# Patient Record
Sex: Female | Born: 1975 | Hispanic: Yes | Marital: Married | Smoking: Never smoker
Health system: Southern US, Community
[De-identification: ages and names within clinical notes are randomized; demographics above are authoritative.]

---

## 2006-06-24 ENCOUNTER — Emergency Department: Payer: Self-pay | Admitting: Emergency Medicine

## 2009-03-18 ENCOUNTER — Emergency Department: Payer: Self-pay | Admitting: Unknown Physician Specialty

## 2009-06-08 ENCOUNTER — Ambulatory Visit: Payer: Self-pay | Admitting: Family Medicine

## 2016-02-25 ENCOUNTER — Ambulatory Visit
Admission: RE | Admit: 2016-02-25 | Discharge: 2016-02-25 | Disposition: A | Discharge status: Home / Self Care | Payer: Self-pay | Source: Ambulatory Visit | Attending: Oncology | Admitting: Oncology

## 2016-02-25 ENCOUNTER — Ambulatory Visit: Payer: Self-pay | Attending: Oncology | Admitting: *Deleted

## 2016-02-25 ENCOUNTER — Ambulatory Visit: Payer: Self-pay

## 2016-02-25 VITALS — BP 114/69 | HR 63 | Temp 97.9°F | Ht 63.39 in | Wt 141.5 lb

## 2016-02-25 DIAGNOSIS — N644 Mastodynia: Secondary | ICD-10-CM

## 2016-02-25 NOTE — Patient Instructions (Signed)
PATIENT INSTRUCTIONS FIBROCYSTIC BREAST DISEASE  FOLLOW-UP:  Call your physician should you develop a new breast mass that is different, if one particular lump starts to enlarge, or if nipple discharge develops.   CAUSE:  Many women have some lumpiness within their breasts and these areas at times can become tender during certain times in your menstrual cycle.  These areas tend to feel like a firm rubber ball as compared to a cancer which will more commonly feel hard and almost rock-like.  Fibrocystic breast disease does not in and of itself increase your risk for breast cancer but you should be sure to examine yiour breasts at the same time of the month on a monthly basis.  If there are a lot of areas of lumpiness you should tape a piece of paper on the mirror with a diagram of your breasts, noting where the areas of lumpiness are and their relative size.  You can then refer to this diagram on a monthly basis to keep better track of any changes should they occur.  DIET:  You should try and avoid foods, or at least minimize foods, such as chocolate and caffeine which may cause the symptoms of tenderness to become worse.  ACTIVITY:  You may want to wear a bra that offers additional support, and/or consider a sports bra, especially during those times when your breasts are more tender.  MEDICATIONS:  Taking Vitamin E capsules twice a day along with Evening of Primrose Capsules three times a day, or as directed on the bottle, may help your symptoms.  These are both available over-the-counter and without a prescription. There is clinical evidence that these may help symptoms in some patients.   If your physician has prescribed medication for your fibrocystic breast disease, be sure to take it as instructed on the bottle and let him/her know if you have any side effects.  QUESTIONS:  Please feel free to call your physician  if you have any questions, and they will be glad to assist you.   Gave patient  hand-out, Women Staying Healthy, Active and Well from [facility], with education on breast health, pap smears, heart and colon health.   

## 2016-02-25 NOTE — Progress Notes (Signed)
Subjective:     Patient ID: Wandra Feinsteinora C Luna Maradiaga, female   DOB: 08/07/1975, 40 y.o.   MRN: [id number]  HPI   Review of Systems     Objective:   Physical Exam  Pulmonary/Chest: Right breast exhibits tenderness. Right breast exhibits no inverted nipple, no mass, no nipple discharge and no skin change. Left breast exhibits tenderness. Left breast exhibits no inverted nipple, no mass, no nipple discharge and no skin change. Breasts are symmetrical.         Assessment:     40 year old Hispanic [facility]  [name], the interpreter present during the interview and exam.  Patient states she has experienced breast pain for over 19 years, since her daughter was born.  States it's worse with her periods and then gets better.  States she has increased "stabbing pain" in the lower outer quadrant of the left breast.  States it her hurts when lays on her left side, and it radiates from the left outer quadrant to the center of the chest while sitting up.  She states she does drink some caffeine, but takes Excedrin daily.  On clinical breast exam, there is no dominant mass, skin changes or lymphadenopathy.  Bilateral breast are tender to palpation. Taught self breast awareness.  Patient has been screened for eligibility.  She does not have any insurance, Medicare or Medicaid.  She also meets financial eligibility.  Hand-out given on the Affordable Care Act.    Plan:     Bilateral diagnostic mammogram with ultrasound ordered.  Hand-out on how to manage breast pain given to patient.  She is to decrease her caffeine intake, and try over the counter vitamin E.  Will follow-up per [facility] protocol.

## 2016-02-26 ENCOUNTER — Encounter: Payer: Self-pay | Admitting: *Deleted

## 2016-02-26 NOTE — Progress Notes (Signed)
Patient with normal mammogram results.  Since patient has experienced breast pain for the last 19 years I don't believe any further work-up is needed.  She is to try the Vitamin E as dicussed at her visit.  Letter mailed to inform patient of her normal mammogram results.  She is to follow-up in one year with annual screening.  HSIS to Ronanhristy.

## 2017-04-27 ENCOUNTER — Other Ambulatory Visit: Payer: Self-pay | Admitting: *Deleted

## 2017-04-27 ENCOUNTER — Encounter: Payer: Self-pay | Admitting: *Deleted

## 2017-04-27 ENCOUNTER — Encounter (INDEPENDENT_AMBULATORY_CARE_PROVIDER_SITE_OTHER): Payer: Self-pay

## 2017-04-27 ENCOUNTER — Ambulatory Visit: Payer: Self-pay | Attending: Oncology | Admitting: *Deleted

## 2017-04-27 ENCOUNTER — Ambulatory Visit
Admission: RE | Admit: 2017-04-27 | Discharge: 2017-04-27 | Disposition: A | Discharge status: Home / Self Care | Payer: Self-pay | Source: Ambulatory Visit | Attending: Oncology | Admitting: Oncology

## 2017-04-27 VITALS — BP 108/76 | HR 77 | Temp 97.3°F | Ht 63.0 in | Wt 144.0 lb

## 2017-04-27 DIAGNOSIS — Z0000 Encounter for general adult medical examination without abnormal findings: Secondary | ICD-10-CM

## 2017-04-27 DIAGNOSIS — N63 Unspecified lump in unspecified breast: Secondary | ICD-10-CM

## 2017-04-27 NOTE — Patient Instructions (Signed)
Gave patient hand-out, Women Staying Healthy, Active and Well from [facility], with education on breast health, pap smears, heart and colon health. 

## 2017-04-27 NOTE — Progress Notes (Signed)
Subjective:     Patient ID: Deborah Page, female   DOB: 11/27/1975, 42 y.o.   MRN: [id number]  HPI   Review of Systems     Objective:   Physical Exam  Pulmonary/Chest: Right breast exhibits no inverted nipple, no mass, no nipple discharge, no skin change and no tenderness. Left breast exhibits no inverted nipple, no mass, no nipple discharge, no skin change and no tenderness. Breasts are symmetrical.         Assessment:     42 year old Hispanic female [facility].  [name], the interpreter present during the interview and exam.  On clinical breast exam the patient has extremely tender breast to palpation.  Bilateral breast have very grainy fibroglandular tissue.  Reviewed history of breast pain with patient per my notes from last year.  States she has had breast pain since there birth of her child.  She states she did try vitamin E as recommended last year with some relief.  States she take 3 Excedrin per day for her headaches.  Discussed correlation of breast pain and caffeine intake.  Explained that Excedrin has a lot of caffeine.  Encouraged her to decrease caffeine by reducing the amount of Excedrin she takes per day.  States she was given a prescription to take for headaches, but she has not tried it yet.  States she will try the new prescription, decrease the Excedrin and try the Vitamin E again.  I also encouraged her to discuss taking the vitamin E with her pharmacist.  She is agreeable.  Taught self breast awareness.  Last pap on 10/25/16 was negative without HPV co-testing.  Next pap due in 2021.  Patient has been screened for eligibility.  She does not have any insurance, Medicare or Medicaid.  She also meets financial eligibility.  Hand-out given on the Affordable Care Act.      Plan:     Screening mammogram ordered.  Will follow-up per [facility] protocol.

## 2017-04-28 ENCOUNTER — Other Ambulatory Visit: Payer: Self-pay | Admitting: *Deleted

## 2017-05-08 ENCOUNTER — Ambulatory Visit
Admission: RE | Admit: 2017-05-08 | Discharge: 2017-05-08 | Disposition: A | Discharge status: Home / Self Care | Payer: Self-pay | Source: Ambulatory Visit | Attending: Oncology | Admitting: Oncology

## 2017-05-08 DIAGNOSIS — N63 Unspecified lump in unspecified breast: Secondary | ICD-10-CM

## 2017-05-20 ENCOUNTER — Encounter: Payer: Self-pay | Admitting: *Deleted

## 2017-05-20 NOTE — Progress Notes (Signed)
Letter mailed from [facility] to inform patient of her normal mammogram results.  Patient is to follow-up with annual screening in one year.  HSIS to Christy. 

## 2018-05-19 ENCOUNTER — Ambulatory Visit
Admission: RE | Admit: 2018-05-19 | Discharge: 2018-05-19 | Disposition: A | Discharge status: Home / Self Care | Payer: Self-pay | Source: Ambulatory Visit | Attending: Oncology | Admitting: Oncology

## 2018-05-19 ENCOUNTER — Encounter (INDEPENDENT_AMBULATORY_CARE_PROVIDER_SITE_OTHER): Payer: Self-pay

## 2018-05-19 ENCOUNTER — Ambulatory Visit: Payer: Self-pay | Attending: Oncology | Admitting: *Deleted

## 2018-05-19 ENCOUNTER — Encounter: Payer: Self-pay | Admitting: *Deleted

## 2018-05-19 VITALS — BP 127/85 | HR 90 | Temp 98.2°F | Ht 62.0 in | Wt 139.7 lb

## 2018-05-19 DIAGNOSIS — Z0000 Encounter for general adult medical examination without abnormal findings: Secondary | ICD-10-CM | POA: Insufficient documentation

## 2018-05-19 NOTE — Patient Instructions (Signed)
Gave patient hand-out, Women Staying Healthy, Active and Well from [facility], with education on breast health, pap smears, heart and colon health. 

## 2018-05-19 NOTE — Progress Notes (Signed)
  Subjective:     Patient ID: Deborah Page, female   DOB: 09/29/1975, 43 y.o.   MRN: [id number]  HPI   Review of Systems     Objective:   Physical Exam Chest:     Breasts:        Right: No swelling, bleeding, inverted nipple, mass, nipple discharge, skin change or tenderness.        Left: No swelling, bleeding, inverted nipple, mass, nipple discharge, skin change or tenderness.    Lymphadenopathy:     Upper Body:     Right upper body: No supraclavicular or axillary adenopathy.     Left upper body: No supraclavicular or axillary adenopathy.        Assessment:     43 year old Hispanic female [facility]  Deborah Page, the interpreter present during the interview and exam.  Clinical breast exam unremarkable.  Taught self breast awareness.  Last pap on 11/04/16 was negative / without HPV co-testing.  Next pap due in 2021.  Patient has been screened for eligibility.  She does not have any insurance, Medicare or Medicaid.  She also meets financial eligibility.  Hand-out given on the Affordable Care Act.  Risk Assessment    Risk Scores      05/19/2018   Last edited by: Alta Corningover, Melissa G, CMA   5-year risk: 0.4 %   Lifetime risk: 6.3 %            Plan:     Screening mammogram ordered.  Will follow-up per [facility] protocol.

## 2018-06-07 ENCOUNTER — Encounter: Payer: Self-pay | Admitting: *Deleted

## 2018-06-07 NOTE — Progress Notes (Signed)
Letter mailed from [facility] to inform patient of her normal mammogram results.  Patient is to follow-up with annual screening in one year.  HSIS to Christy. 

## 2019-07-18 ENCOUNTER — Ambulatory Visit: Payer: Self-pay | Attending: Internal Medicine

## 2019-07-22 ENCOUNTER — Ambulatory Visit: Payer: Self-pay | Attending: Internal Medicine

## 2019-07-22 ENCOUNTER — Ambulatory Visit: Payer: Self-pay

## 2019-07-22 NOTE — Progress Notes (Signed)
   Covid-19 Vaccination Clinic  Name:  Deborah Page    MRN: [id number] DOB: 1975-09-15  07/22/2019  Ms. Deborah Page was observed post Covid-19 immunization for 15 minutes without incident. She was provided with Vaccine Information Sheet and instruction to access the V-Safe system.   Ms. Deborah Page was instructed to call 911 with any severe reactions post vaccine: Marland Kitchen Difficulty breathing  . Swelling of face and throat  . A fast heartbeat  . A bad rash all over body  . Dizziness and weakness

## 2019-08-12 ENCOUNTER — Ambulatory Visit: Payer: Self-pay | Attending: Internal Medicine

## 2019-08-12 DIAGNOSIS — Z23 Encounter for immunization: Secondary | ICD-10-CM

## 2019-08-12 NOTE — Progress Notes (Signed)
   Covid-19 Vaccination Clinic  Name:  Deborah Page    MRN: [id number] DOB: 09/12/1975  08/12/2019  Ms. Deborah Page was observed post Covid-19 immunization for 15 minutes without incident. She was provided with Vaccine Information Sheet and instruction to access the V-Safe system.   Ms. Deborah Page was instructed to call 911 with any severe reactions post vaccine: Marland Kitchen Difficulty breathing  . Swelling of face and throat  . A fast heartbeat  . A bad rash all over body  . Dizziness and weakness   Immunizations Administered    Name Date Dose VIS Date Route   Pfizer COVID-19 Vaccine 08/12/2019  1:52 PM 0.3 mL 06/15/2018 Intramuscular   Manufacturer: [employer], [employer]   Lot: [id number]   NDC: [address]-3

## 2019-10-18 ENCOUNTER — Ambulatory Visit
Admission: RE | Admit: 2019-10-18 | Discharge: 2019-10-18 | Disposition: A | Discharge status: Home / Self Care | Payer: Self-pay | Source: Ambulatory Visit | Attending: Oncology | Admitting: Oncology

## 2019-10-18 ENCOUNTER — Other Ambulatory Visit: Payer: Self-pay

## 2019-10-18 ENCOUNTER — Ambulatory Visit: Payer: Self-pay | Attending: Oncology

## 2019-10-18 VITALS — BP 111/72 | HR 72 | Temp 97.6°F | Resp 20 | Ht 62.0 in | Wt 141.9 lb

## 2019-10-18 DIAGNOSIS — Z0000 Encounter for general adult medical examination without abnormal findings: Secondary | ICD-10-CM

## 2019-10-18 NOTE — Progress Notes (Signed)
  Subjective:     Patient ID: Deborah Page, female   DOB: 02/17/1976, 44 y.o.   MRN: [id number]  HPI   Review of Systems     Objective:   Physical Exam Chest:     Breasts:        Right: No swelling, bleeding, inverted nipple, mass, nipple discharge, skin change or tenderness.        Left: No swelling, bleeding, inverted nipple, mass, nipple discharge, skin change or tenderness.          Assessment:     44 year old Hispanic patient returns for [facility] screening.  Delos Haring interpreted exam.  Patient screened, and meets [facility] eligibility.  Patient does not have insurance, Medicare or Medicaid. Instructed patient on breast self awareness using teach back method. Clinical breast exam unremarkable. Symmetrical fibroglandular tissue palpated bilateral in lower breasts.    Plan:     Sent for bilateral screening mammogram.

## 2019-10-18 NOTE — Progress Notes (Signed)
Already has an pap smear appt for jul

## 2019-10-21 NOTE — Progress Notes (Signed)
Letter mailed from [facility] to notify of normal mammogram results.  Patient to return in one year for annual screening.  Copy to HSIS. 

## 2020-10-23 ENCOUNTER — Encounter: Payer: Self-pay | Admitting: *Deleted

## 2020-10-23 ENCOUNTER — Ambulatory Visit: Payer: Self-pay | Attending: Oncology | Admitting: *Deleted

## 2020-10-23 ENCOUNTER — Other Ambulatory Visit: Payer: Self-pay

## 2020-10-23 ENCOUNTER — Ambulatory Visit
Admission: RE | Admit: 2020-10-23 | Discharge: 2020-10-23 | Disposition: A | Discharge status: Home / Self Care | Payer: Self-pay | Source: Ambulatory Visit | Attending: Oncology | Admitting: Oncology

## 2020-10-23 VITALS — BP 115/77 | HR 76 | Temp 97.0°F | Ht 60.0 in | Wt 141.0 lb

## 2020-10-23 DIAGNOSIS — Z0000 Encounter for general adult medical examination without abnormal findings: Secondary | ICD-10-CM | POA: Insufficient documentation

## 2020-10-23 NOTE — Progress Notes (Signed)
  Subjective:     Patient ID: Deborah Page, female   DOB: January 27, 1976, 45 y.o.   MRN: [id number]  HPI   Review of Systems     Objective:   Physical Exam Chest:  Breasts:    Right: No swelling, bleeding, inverted nipple, mass, nipple discharge, skin change, tenderness, axillary adenopathy or supraclavicular adenopathy.     Left: No swelling, bleeding, inverted nipple, mass, nipple discharge, skin change, tenderness, axillary adenopathy or supraclavicular adenopathy.  Lymphadenopathy:     Upper Body:     Right upper body: No supraclavicular or axillary adenopathy.     Left upper body: No supraclavicular or axillary adenopathy.      Assessment:     45 year old female returns to [facility] for [facility] exam.  Donnie Coffin # [id number] [facility] interpreters used via video during the interview and exam.  Clinical breast exam unremarkable.  Taught self breast awareness.  Last pap smear was on 11/14/19.  Next pap will be due per [facility] guidelines. Patient has been screened for eligibility.  She does not have any insurance, Medicare or Medicaid.  She also meets financial eligibility.   Risk Assessment     Risk Scores       10/23/2020 10/18/2019   Last edited by: Scarlett Presto, RN Neita Garnet, CMA   5-year risk: 0.4 % 0.4 %   Lifetime risk: 6.2 % 6.3 %              Plan:     Screening mammogram ordered.  Will follow up per [facility] protocol.

## 2020-10-23 NOTE — Patient Instructions (Signed)
Gave patient hand-out, Women Staying Healthy, Active and Well from [facility], with education on breast health, pap smears, heart and colon health. 

## 2020-10-25 ENCOUNTER — Encounter: Payer: Self-pay | Admitting: *Deleted

## 2020-10-25 NOTE — Progress Notes (Signed)
Letter mailed from [facility] to inform patient of her normal mammogram results.  Patient is to follow-up with annual screening in one year. 

## 2021-12-10 ENCOUNTER — Ambulatory Visit: Payer: Self-pay

## 2022-01-09 ENCOUNTER — Other Ambulatory Visit: Payer: Self-pay

## 2022-01-09 DIAGNOSIS — Z1231 Encounter for screening mammogram for malignant neoplasm of breast: Secondary | ICD-10-CM

## 2022-01-14 ENCOUNTER — Ambulatory Visit: Payer: Self-pay | Attending: Hematology and Oncology | Admitting: Hematology and Oncology

## 2022-01-14 ENCOUNTER — Ambulatory Visit
Admission: RE | Admit: 2022-01-14 | Discharge: 2022-01-14 | Disposition: A | Discharge status: Home / Self Care | Payer: Self-pay | Source: Ambulatory Visit | Attending: Obstetrics and Gynecology | Admitting: Obstetrics and Gynecology

## 2022-01-14 ENCOUNTER — Other Ambulatory Visit: Payer: Self-pay

## 2022-01-14 VITALS — BP 128/77 | Wt 143.7 lb

## 2022-01-14 DIAGNOSIS — Z1211 Encounter for screening for malignant neoplasm of colon: Secondary | ICD-10-CM

## 2022-01-14 DIAGNOSIS — Z1231 Encounter for screening mammogram for malignant neoplasm of breast: Secondary | ICD-10-CM

## 2022-01-14 NOTE — Progress Notes (Signed)
Deborah Page is a 46 y.o. female who presents to [facility] clinic today with no complaints .    Pap Smear: Pap not smear completed today. Last Pap smear was 2021 at [facility] clinic and was normal. Per patient has no history of an abnormal Pap smear. Last Pap smear result is not available in Epic.   Physical exam: Breasts Breasts symmetrical. No skin abnormalities bilateral breasts. No nipple retraction bilateral breasts. No nipple discharge bilateral breasts. No lymphadenopathy. No lumps palpated bilateral breasts.     MS DIGITAL SCREENING TOMO BILATERAL  Result Date: 10/24/2020 CLINICAL DATA:  Screening. EXAM: DIGITAL SCREENING BILATERAL MAMMOGRAM WITH TOMOSYNTHESIS AND CAD TECHNIQUE: Bilateral screening digital craniocaudal and mediolateral oblique mammograms were obtained. Bilateral screening digital breast tomosynthesis was performed. The images were evaluated with computer-aided detection. COMPARISON:  Previous exam(s). ACR Breast Density Category c: The breast tissue is heterogeneously dense, which may obscure small masses. FINDINGS: There are no findings suspicious for malignancy. IMPRESSION: No mammographic evidence of malignancy. A result letter of this screening mammogram will be mailed directly to the patient. RECOMMENDATION: Screening mammogram in one year. (Code:SM-B-01Y) BI-RADS CATEGORY  1: Negative. Electronically Signed   By: Lillia Mountain M.D.   On: 10/24/2020 15:07  MS DIGITAL SCREENING TOMO BILATERAL  Result Date: 10/20/2019 CLINICAL DATA:  Screening. EXAM: DIGITAL SCREENING BILATERAL MAMMOGRAM WITH TOMO AND CAD COMPARISON:  Previous exam(s). ACR Breast Density Category d: The breast tissue is extremely dense, which lowers the sensitivity of mammography FINDINGS: There are no findings suspicious for malignancy. Images were processed with CAD. IMPRESSION: No mammographic evidence of malignancy. A result letter of this screening mammogram will be mailed directly to the patient.  RECOMMENDATION: Screening mammogram in one year. (Code:SM-B-01Y) BI-RADS CATEGORY  1: Negative. Electronically Signed   By: Lovey Newcomer M.D.   On: 10/20/2019 09:15   MS DIGITAL SCREENING TOMO BILATERAL  Result Date: 05/19/2018 CLINICAL DATA:  Screening. EXAM: DIGITAL SCREENING BILATERAL MAMMOGRAM WITH TOMO AND CAD COMPARISON:  Previous exam(s). ACR Breast Density Category d: The breast tissue is extremely dense, which lowers the sensitivity of mammography FINDINGS: There are no findings suspicious for malignancy. Images were processed with CAD. IMPRESSION: No mammographic evidence of malignancy. A result letter of this screening mammogram will be mailed directly to the patient. RECOMMENDATION: Screening mammogram in one year. (Code:SM-B-01Y) BI-RADS CATEGORY  1: Negative. Electronically Signed   By: Lajean Manes M.D.   On: 05/19/2018 13:58   MS DIGITAL DIAG TOMO UNI LEFT  Result Date: 05/08/2017 CLINICAL DATA:  Callback from screening mammogram for a possible mass left breast EXAM: 2D DIGITAL DIAGNOSTIC LEFT MAMMOGRAM WITH CAD AND ADJUNCT TOMO ULTRASOUND LEFT BREAST COMPARISON:  Previous exam(s). ACR Breast Density Category d: The breast tissue is extremely dense, which lowers the sensitivity of mammography. FINDINGS: Lateral view of left breast, spot compression left MLO view are submitted. The previously questioned mass persists and appears to have focal notched fat in the axillary tail. Mammographic images were processed with CAD. Targeted ultrasound is performed, showing a 2.2 cm normal intramammary lymph node at the left axillary tail left breast 1:30 o'clock 7 cm from nipple correlating to the mammographic finding. IMPRESSION: Benign findings. RECOMMENDATION: Routine screening mammogram back on schedule. I have discussed the findings and recommendations with the patient. Results were also provided in writing at the conclusion of the visit. If applicable, a reminder letter will be sent to the patient  regarding the next appointment. BI-RADS CATEGORY  2: Benign.  Electronically Signed   By: Abelardo Diesel M.D.   On: 05/08/2017 14:23   MS DIGITAL SCREENING TOMO BILATERAL  Result Date: 04/27/2017 CLINICAL DATA:  Screening. EXAM: 2D DIGITAL SCREENING BILATERAL MAMMOGRAM WITH 3D TOMO WITH CAD COMPARISON:  Previous exam(s). ACR Breast Density Category c: The breast tissue is heterogeneously dense, which may obscure small masses. FINDINGS: In the left breast, a possible mass warrants further evaluation. In the right breast, no findings suspicious for malignancy. Images were processed with CAD. IMPRESSION: Further evaluation is suggested for possible mass in the left breast. RECOMMENDATION: Diagnostic mammogram and possibly ultrasound of the left breast. (Code:FI-L-37M) The patient will be contacted regarding the findings, and additional imaging will be scheduled. BI-RADS CATEGORY  0: Incomplete. Need additional imaging evaluation and/or prior mammograms for comparison. Electronically Signed   By: Lovey Newcomer M.D.   On: 04/27/2017 12:34      Pelvic/Bimanual Pap is not indicated today    Smoking History: Patient has never smoked and was not referred to quit line.    Patient Navigation: Patient education provided. Access to services provided for patient through [place] interpreter provided. No transportation provided   Colorectal Cancer Screening: Per patient has never had colonoscopy completed No complaints today.    Breast and Cervical Cancer Risk Assessment: Patient does not have family history of breast cancer, known genetic mutations, or radiation treatment to the chest before age 40. Patient does not have history of cervical dysplasia, immunocompromised, or DES exposure in-utero.  Risk Assessment     Risk Scores       01/14/2022 10/23/2020   Last edited by: Demetrius Revel, LPN Theodore Demark, RN   5-year risk: 0.5 % 0.4 %   Lifetime risk: 6.1 % 6.2 %             A: [facility] exam without pap smear No complaints with benign exam.  P: Referred patient to the [facility] for a screening mammogram. Appointment scheduled 01/14/2009.  Dayton Scrape A, NP 01/14/2022 9:09 AM

## 2022-01-19 IMAGING — MG DIGITAL SCREENING BILAT W/ TOMO W/ CAD
8 series · 8 of 24 positions shown · non-contrast
Comparison: Previous exam(s).

CLINICAL DATA: Screening.

EXAM:
DIGITAL SCREENING BILATERAL MAMMOGRAM WITH TOMO AND CAD

[L MLO synth-2D]
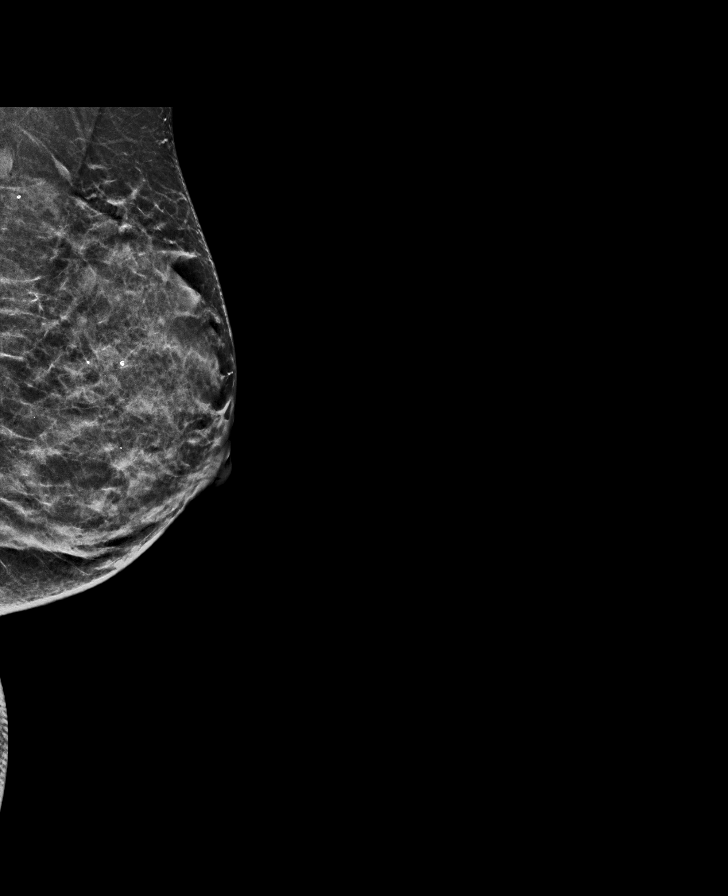

[R MLO synth-2D]
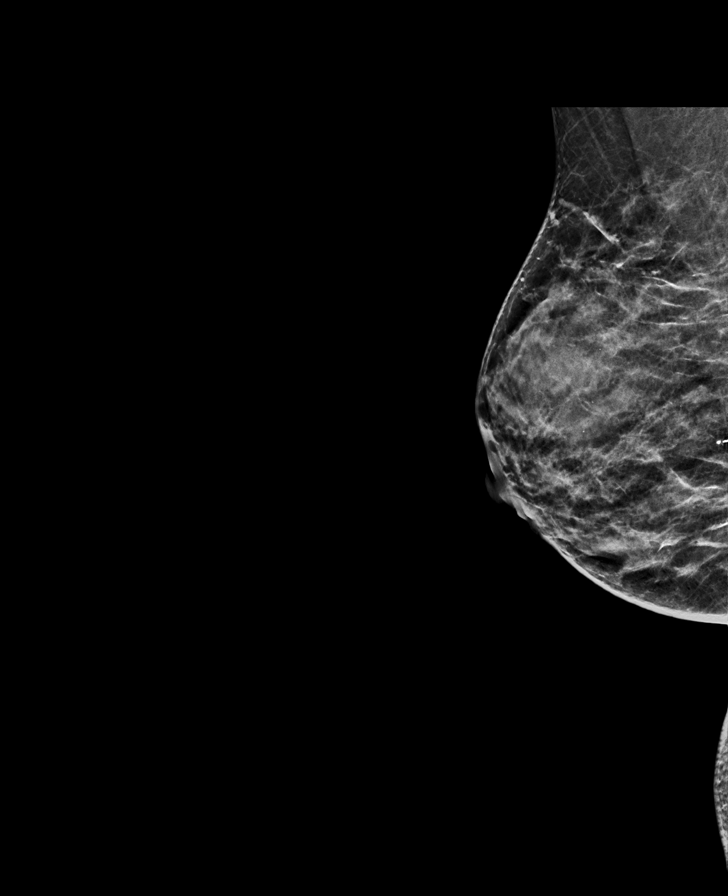

[R CC synth-2D]
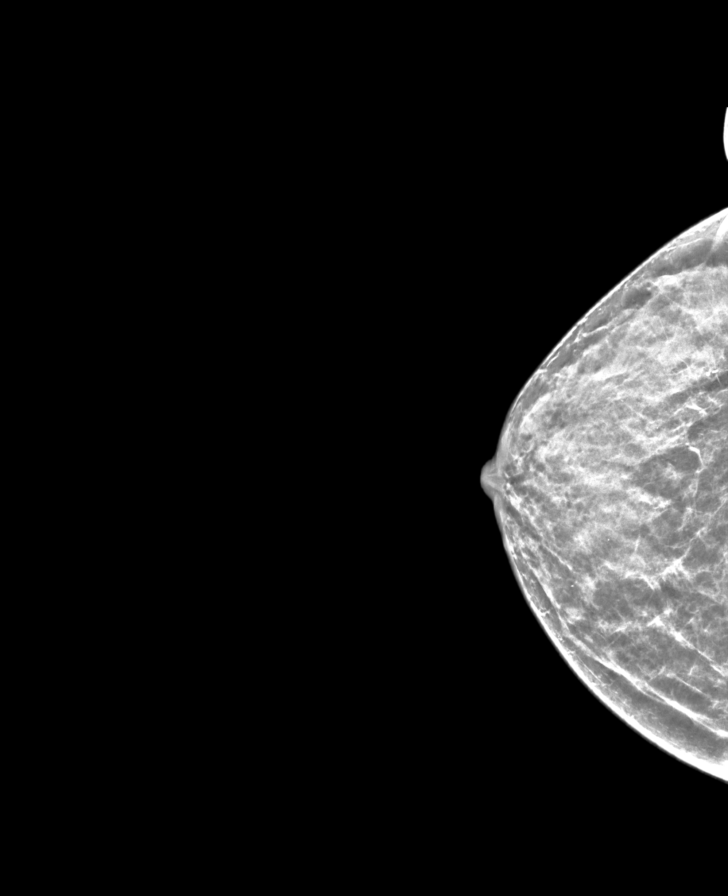

[L CC synth-2D]
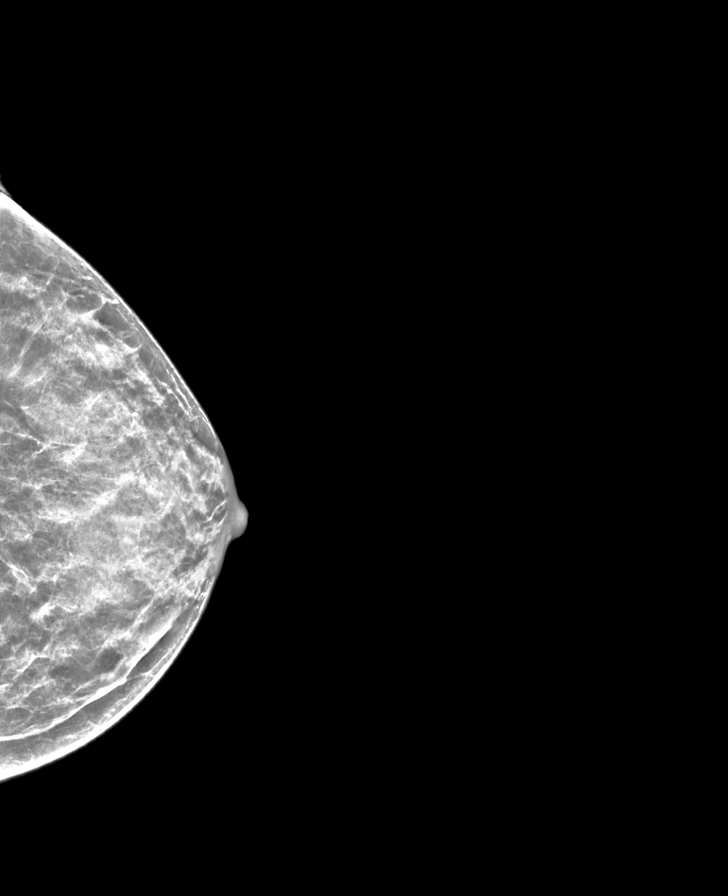

[L CC tomo · tomo slice 26/51.0]
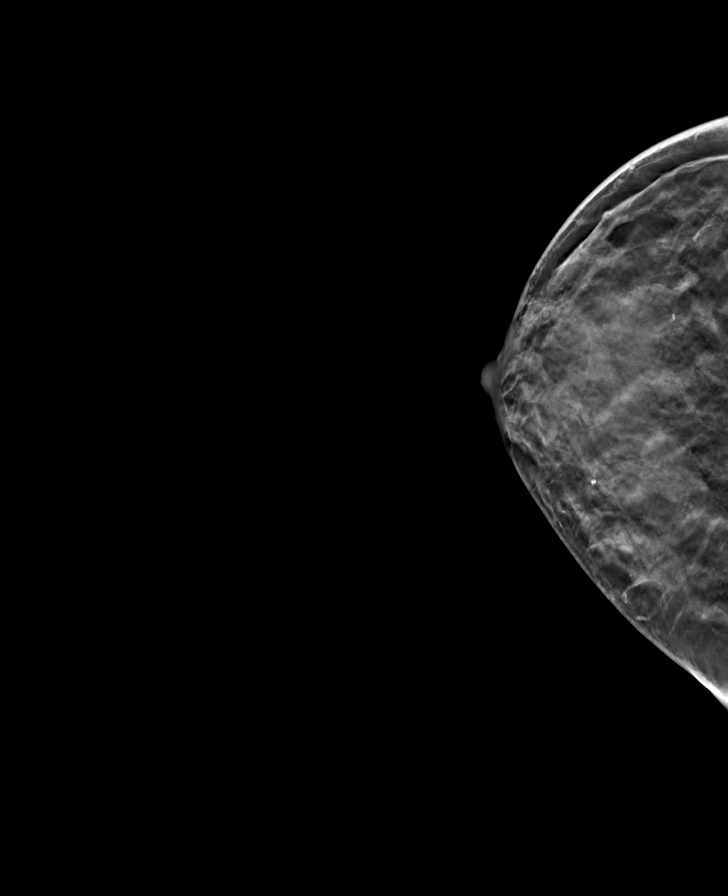

[R CC tomo · tomo slice 27/53.0]
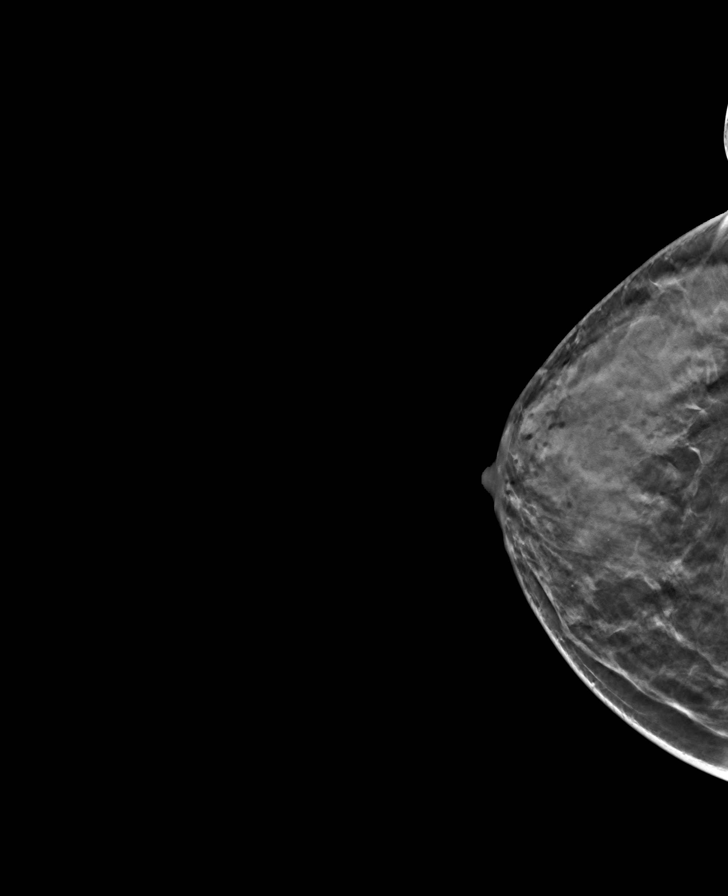

[L MLO tomo · tomo slice 28/55.0]
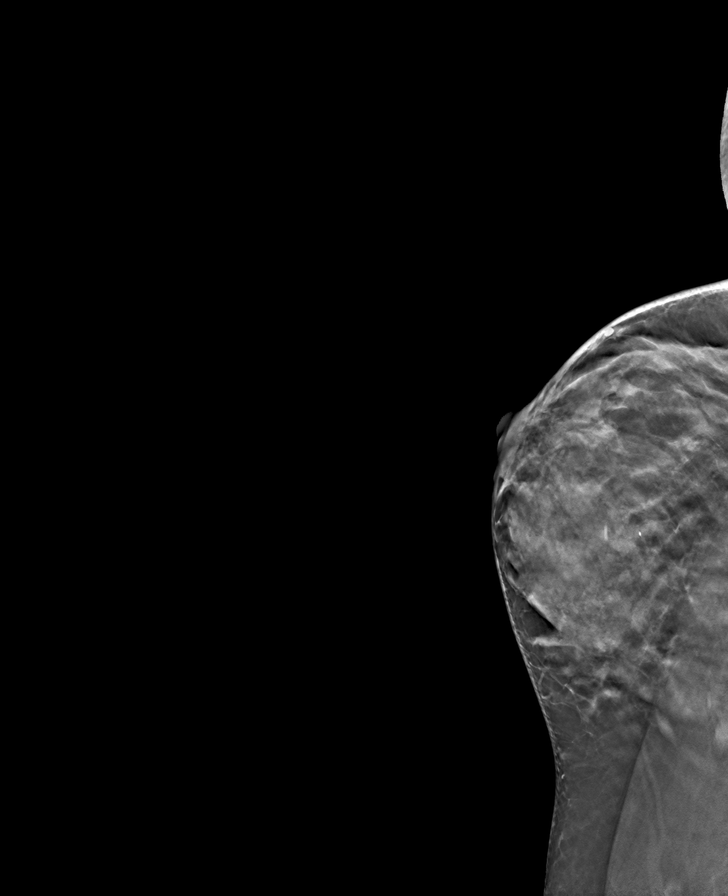

[R MLO tomo · tomo slice 27/53.0]
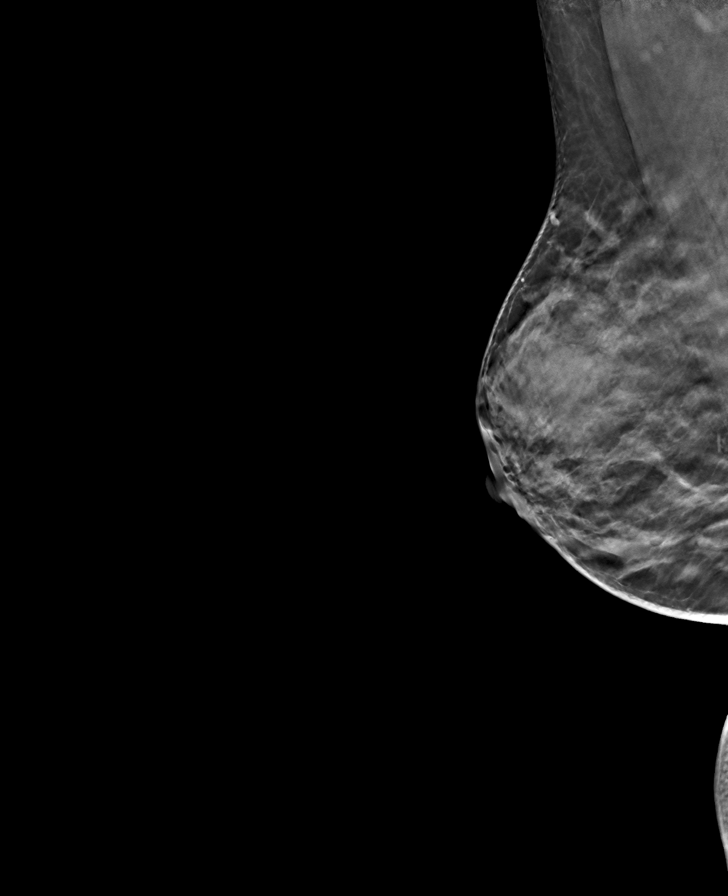

[8 of 24 positions shown; findings below may reference images not displayed]

ACR Breast Density Category d: The breast tissue is extremely dense,
which lowers the sensitivity of mammography
FINDINGS: There are no findings suspicious for malignancy. Images were
processed with CAD.
IMPRESSION: No mammographic evidence of malignancy. A result letter of this
screening mammogram will be mailed directly to the patient.

RECOMMENDATION:
Screening mammogram in one year. (Code:WO-[id number])

BI-RADS CATEGORY  1: Negative.

## 2022-08-11 ENCOUNTER — Encounter: Payer: Self-pay | Admitting: Family Medicine

## 2022-08-19 ENCOUNTER — Ambulatory Visit: Payer: Self-pay

## 2022-11-17 ENCOUNTER — Ambulatory Visit: Payer: Self-pay

## 2022-12-01 ENCOUNTER — Ambulatory Visit: Payer: Self-pay | Attending: Oncology | Admitting: Hematology and Oncology

## 2022-12-01 ENCOUNTER — Other Ambulatory Visit: Payer: Self-pay

## 2022-12-01 DIAGNOSIS — Z124 Encounter for screening for malignant neoplasm of cervix: Secondary | ICD-10-CM

## 2022-12-01 NOTE — Progress Notes (Signed)
Patient: Deborah Page           Date of Birth: 01-13-1976           MRN: [id number] Visit Date: 12/01/2022 PCP: Preston Fleeting, MD     Cervical Exam Pap smear completed: Pap test Abnormal Observations: Normal exam.  Recommendations: Repeat in 5 years if normal and negative HPV.    Patient's History There are no problems to display for this patient.  No past medical history on file.  Family History  Problem Relation Age of Onset  . Hypertension Mother   . Diabetes Mother   . Hypertension Father   . Breast cancer Maternal Grandmother     Social History   Occupational History  . Not on file  Tobacco Use  . Smoking status: Never  . Smokeless tobacco: Never  Vaping Use  . Vaping status: Never Used  Substance and Sexual Activity  . Alcohol use: Not Currently  . Drug use: Never  . Sexual activity: Yes    Birth control/protection: None    Comment: Husband-vasectomy

## 2022-12-01 NOTE — Patient Instructions (Signed)
Taught Deborah Page about self breast awareness and gave educational materials to take home. Patient did not need a Pap smear today due to last Pap smear was in  per patient.  Let her know [facility] will cover Pap smears every 5 years unless has a history of abnormal Pap smears. Referred [facility] to [facility] for diagnostic mammogram. Appointment scheduled for 12/01/22. Patient aware of appointment and will be there. Let patient know will follow up with her within the next couple weeks with results. Deborah Page verbalized understanding.  Pascal Lux, NP 1:01 PM

## 2023-09-09 ENCOUNTER — Ambulatory Visit (LOCAL_COMMUNITY_HEALTH_CENTER): Payer: Self-pay

## 2023-09-09 DIAGNOSIS — Z23 Encounter for immunization: Secondary | ICD-10-CM

## 2023-09-09 DIAGNOSIS — Z719 Counseling, unspecified: Secondary | ICD-10-CM

## 2023-09-09 NOTE — Progress Notes (Signed)
 Pt in nurse clinic requesting vaccines as needed for Immigration. Reviewed NCIR and EPIC, pt meets criteria for free Tdap, Twinrix, and MMR per ACIP guidelines, pt paid Varicella and Polio. Discussed VIS and given to pt, agreed and administered vaccines, tolerated well. Given NCIR copies, informed of next vaccine due dates, pt expressed understanding.

## 2023-10-14 ENCOUNTER — Other Ambulatory Visit: Payer: Self-pay

## 2023-10-14 DIAGNOSIS — Z1231 Encounter for screening mammogram for malignant neoplasm of breast: Secondary | ICD-10-CM

## 2023-10-26 ENCOUNTER — Ambulatory Visit: Payer: Self-pay | Attending: Obstetrics and Gynecology | Admitting: *Deleted

## 2023-10-26 ENCOUNTER — Other Ambulatory Visit: Payer: Self-pay

## 2023-10-26 ENCOUNTER — Ambulatory Visit
Admission: RE | Admit: 2023-10-26 | Discharge: 2023-10-26 | Disposition: A | Discharge status: Home / Self Care | Payer: Self-pay | Source: Ambulatory Visit | Attending: Obstetrics and Gynecology | Admitting: Obstetrics and Gynecology

## 2023-10-26 VITALS — BP 118/77 | Ht 60.0 in | Wt 150.0 lb

## 2023-10-26 DIAGNOSIS — Z1239 Encounter for other screening for malignant neoplasm of breast: Secondary | ICD-10-CM

## 2023-10-26 DIAGNOSIS — Z1231 Encounter for screening mammogram for malignant neoplasm of breast: Secondary | ICD-10-CM | POA: Insufficient documentation

## 2023-10-26 DIAGNOSIS — Z1211 Encounter for screening for malignant neoplasm of colon: Secondary | ICD-10-CM

## 2023-10-26 NOTE — Progress Notes (Signed)
 Deborah Page is a 48 y.o. female who presents to [facility] clinic today with complaint of bilateral diffuse breast pain for years. Last screening mammogram was completed 01/14/2022 and was negative.   Pap Smear: Pap smear not completed today. Last Pap smear was 08/01/2022 at [facility] clinic and was normal with negative HPV. Per patient has no history of an abnormal Pap smear. Last Pap smear result is available in Epic.   Physical exam: Breasts Breasts symmetrical. No skin abnormalities bilateral breasts. No nipple retraction bilateral breasts. No nipple discharge bilateral breasts. No lymphadenopathy. No lumps palpated bilateral breasts. Complaints of bilateral diffuse breast pain on exam.  MS DIGITAL SCREENING TOMO BILATERAL Result Date: 01/15/2022 CLINICAL DATA:  Screening. EXAM: DIGITAL SCREENING BILATERAL MAMMOGRAM WITH TOMOSYNTHESIS AND CAD TECHNIQUE: Bilateral screening digital craniocaudal and mediolateral oblique mammograms were obtained. Bilateral screening digital breast tomosynthesis was performed. The images were evaluated with computer-aided detection. COMPARISON:  Previous exam(s). ACR Breast Density Category d: The breast tissue is extremely dense, which lowers the sensitivity of mammography FINDINGS: There are no findings suspicious for malignancy. IMPRESSION: No mammographic evidence of malignancy. A result letter of this screening mammogram will be mailed directly to the patient. RECOMMENDATION: Screening mammogram in one year. (Code:SM-B-01Y) BI-RADS CATEGORY  1: Negative. Electronically Signed   By: Serena  Chacko M.D.   On: 01/15/2022 08:31   MS DIGITAL SCREENING TOMO BILATERAL Result Date: 10/24/2020 CLINICAL DATA:  Screening. EXAM: DIGITAL SCREENING BILATERAL MAMMOGRAM WITH TOMOSYNTHESIS AND CAD TECHNIQUE: Bilateral screening digital craniocaudal and mediolateral oblique mammograms were obtained. Bilateral screening digital breast tomosynthesis was performed. The images were  evaluated with computer-aided detection. COMPARISON:  Previous exam(s). ACR Breast Density Category c: The breast tissue is heterogeneously dense, which may obscure small masses. FINDINGS: There are no findings suspicious for malignancy. IMPRESSION: No mammographic evidence of malignancy. A result letter of this screening mammogram will be mailed directly to the patient. RECOMMENDATION: Screening mammogram in one year. (Code:SM-B-01Y) BI-RADS CATEGORY  1: Negative. Electronically Signed   By: Dina  Arceo M.D.   On: 10/24/2020 15:07  MS DIGITAL SCREENING TOMO BILATERAL Result Date: 10/20/2019 CLINICAL DATA:  Screening. EXAM: DIGITAL SCREENING BILATERAL MAMMOGRAM WITH TOMO AND CAD COMPARISON:  Previous exam(s). ACR Breast Density Category d: The breast tissue is extremely dense, which lowers the sensitivity of mammography FINDINGS: There are no findings suspicious for malignancy. Images were processed with CAD. IMPRESSION: No mammographic evidence of malignancy. A result letter of this screening mammogram will be mailed directly to the patient. RECOMMENDATION: Screening mammogram in one year. (Code:SM-B-01Y) BI-RADS CATEGORY  1: Negative. Electronically Signed   By: Bard Moats M.D.   On: 10/20/2019 09:15    Pelvic/Bimanual Pap is not indicated today per BCCCP guidelines.   Smoking History: Patient has never smoked.   Patient Navigation: Patient education provided. Access to services provided for patient through [facility] program. Spanish interpreter Alan Been from [facility] provided.   Colorectal Cancer Screening: Per patient has never had colonoscopy completed. FIT Test given to patient to complete. No complaints today.    Breast and Cervical Cancer Risk Assessment: Patient has family history of maternal grandmother having breast cancer. Patient has no known genetic mutations or history of radiation treatment to the chest before age 16. Patient does not have history of cervical dysplasia,  immunocompromised, or DES exposure in-utero.  Risk Scores as of Encounter on 10/26/2023     [facility]           5-year 0.57%  Lifetime 6.85%            Last calculated by Silas, Ansyi K, CMA on 10/26/2023 at  2:34 PM        A: BCCCP exam without pap smear Complaint of diffuse breast pain.  P: Referred patient to the [facility] for a screening mammogram. Appointment scheduled Monday, October 26, 2023 at 1500.  Driscilla Wanda SQUIBB, RN 10/26/2023 2:40 PM

## 2023-10-26 NOTE — Patient Instructions (Signed)
 Explained breast self awareness with Altames[employer] JAYSON Hilma Jenness. Patient did not need a Pap smear today due to last Pap smear and HPV typing was 08/01/2022. Let her know BCCCP will cover Pap smears and HPV typing every 5 years unless has a history of abnormal Pap smears. Referred patient to the [facility] for a screening mammogram. Appointment scheduled Monday, October 26, 2023 at 1500. Patient aware of appointment and will be there. Let patient know Raymondo will follow up with her within the next couple weeks with results of her mammogram by letter or phone. Altamese JAYSON Hilma Jenness verbalized understanding.  Ulices Maack, Wanda Ship, RN 2:40 PM

## 2023-10-31 LAB — FECAL OCCULT BLOOD, IMMUNOCHEMICAL: Fecal Occult Bld: NEGATIVE

## 2023-11-02 ENCOUNTER — Ambulatory Visit: Payer: Self-pay
